# Patient Record
Sex: Male | Born: 1955 | Race: White | Hispanic: No | Smoking: Current every day smoker
Health system: Southern US, Community
[De-identification: ages and names within clinical notes are randomized; demographics above are authoritative.]

## PROBLEM LIST (undated history)

## (undated) DIAGNOSIS — I10 Essential (primary) hypertension: Secondary | ICD-10-CM

## (undated) DIAGNOSIS — E7800 Pure hypercholesterolemia, unspecified: Secondary | ICD-10-CM

## (undated) HISTORY — PX: GASTRIC BYPASS: SHX52

---

## 2017-07-31 ENCOUNTER — Ambulatory Visit
Admission: RE | Admit: 2017-07-31 | Discharge: 2017-07-31 | Disposition: A | Discharge status: Home / Self Care | Payer: PRIVATE HEALTH INSURANCE | Source: Ambulatory Visit | Attending: Chiropractic Medicine | Admitting: Chiropractic Medicine

## 2017-07-31 ENCOUNTER — Other Ambulatory Visit: Payer: Self-pay | Admitting: Chiropractic Medicine

## 2017-07-31 DIAGNOSIS — R29898 Other symptoms and signs involving the musculoskeletal system: Secondary | ICD-10-CM

## 2017-12-15 ENCOUNTER — Emergency Department (HOSPITAL_COMMUNITY): Payer: PRIVATE HEALTH INSURANCE

## 2017-12-15 ENCOUNTER — Other Ambulatory Visit: Payer: Self-pay

## 2017-12-15 ENCOUNTER — Emergency Department (HOSPITAL_COMMUNITY)
Admission: EM | Admit: 2017-12-15 | Discharge: 2017-12-15 | Disposition: A | Discharge status: Home / Self Care | Payer: PRIVATE HEALTH INSURANCE | Attending: Emergency Medicine | Admitting: Emergency Medicine

## 2017-12-15 ENCOUNTER — Encounter (HOSPITAL_COMMUNITY): Payer: Self-pay | Admitting: Emergency Medicine

## 2017-12-15 DIAGNOSIS — F17290 Nicotine dependence, other tobacco product, uncomplicated: Secondary | ICD-10-CM | POA: Diagnosis not present

## 2017-12-15 DIAGNOSIS — R079 Chest pain, unspecified: Secondary | ICD-10-CM | POA: Diagnosis not present

## 2017-12-15 DIAGNOSIS — I10 Essential (primary) hypertension: Secondary | ICD-10-CM | POA: Diagnosis not present

## 2017-12-15 HISTORY — DX: Pure hypercholesterolemia, unspecified: E78.00

## 2017-12-15 HISTORY — DX: Essential (primary) hypertension: I10

## 2017-12-15 LAB — COMPREHENSIVE METABOLIC PANEL
ALBUMIN: 4.5 g/dL (ref 3.5–5.0)
ALT: 23 U/L (ref 17–63)
AST: 23 U/L (ref 15–41)
Alkaline Phosphatase: 77 U/L (ref 38–126)
Anion gap: 12 (ref 5–15)
BUN: 14 mg/dL (ref 6–20)
CHLORIDE: 103 mmol/L (ref 101–111)
CO2: 25 mmol/L (ref 22–32)
CREATININE: 0.88 mg/dL (ref 0.61–1.24)
Calcium: 9 mg/dL (ref 8.9–10.3)
GFR calc Af Amer: >60 mL/min (ref >60)
GFR calc non Af Amer: >60 mL/min (ref >60)
Glucose, Bld: 113 mg/dL — ABNORMAL HIGH (ref 65–99)
POTASSIUM: 4 mmol/L (ref 3.5–5.1)
Sodium: 140 mmol/L (ref 135–145)
Total Bilirubin: 0.7 mg/dL (ref 0.3–1.2)
Total Protein: 7.1 g/dL (ref 6.5–8.1)

## 2017-12-15 LAB — LIPASE, BLOOD: LIPASE: 38 U/L (ref 11–51)

## 2017-12-15 LAB — CBC
HEMATOCRIT: 46.2 % (ref 39.0–52.0)
HEMOGLOBIN: 15.2 g/dL (ref 13.0–17.0)
MCH: 29.8 pg (ref 26.0–34.0)
MCHC: 32.9 g/dL (ref 30.0–36.0)
MCV: 90.6 fL (ref 78.0–100.0)
Platelets: 243 10*3/uL (ref 150–400)
RBC: 5.1 MIL/uL (ref 4.22–5.81)
RDW: 13.2 % (ref 11.5–15.5)
WBC: 7.5 10*3/uL (ref 4.0–10.5)

## 2017-12-15 LAB — I-STAT TROPONIN, ED
TROPONIN I, POC: 0.01 ng/mL (ref 0.00–0.08)
Troponin i, poc: 0 ng/mL (ref 0.00–0.08)

## 2017-12-15 MED ORDER — GI COCKTAIL ~~LOC~~
30.0000 mL | Freq: Once | ORAL | Status: AC
  Administered 2017-12-15: 30 mL via ORAL
  Filled 2017-12-15: qty 30

## 2017-12-15 MED ORDER — IBUPROFEN 800 MG PO TABS
800.0000 mg | ORAL_TABLET | Freq: Once | ORAL | Status: AC
  Administered 2017-12-15: 800 mg via ORAL
  Filled 2017-12-15: qty 1

## 2017-12-15 NOTE — Discharge Instructions (Addendum)
Please read attached information. If you experience any new or worsening signs or symptoms please return to the emergency room for evaluation. Please follow-up with your primary care provider or specialist as discussed.  °

## 2017-12-15 NOTE — ED Provider Notes (Signed)
[facility]   CSN: [id number] Arrival date & time: 12/15/17  1727     History   Chief Complaint Chief Complaint  Patient presents with  . Chest Pain    HPI Alexander MerlesMark Mcgee is a 62 y.o. male.  HPI   62 year old male presents today with complaints of chest pain/abdominal pain.  Patient notes approximately 2 and half hours ago while at work he noticed a dull ache in his epigastric and lower chest.  Patient notes some radiation to his back.  He denies any indigestion, lower abdominal pain nausea or vomiting.  He denies any diaphoresis.  No associated shortness of breath, orthopnea or worsening pain with ambulation.  Patient notes that he often has pain in his abdomen and epigastric area as he is status post gastric bypass.  He does note that he had 2 fajitas at lunch.  Patient denies any personal cardiac history, he notes a history of well-controlled hyperlipidemia, hypertension.  He denies any close family history of MIs or significant cardiac disease.  He does note he smokes cigars.  Patient notes he has been significantly stressed over the last week as he potentially has prostate cancer.  No history DVT or PE or any significant risk factors.   Past Medical History:  Diagnosis Date  . Hypercholesteremia   . Hypertension     There are no active problems to display for this patient.   Past Surgical History:  Procedure Laterality Date  . GASTRIC BYPASS         Home Medications    Prior to Admission medications   Not on File    Family History History reviewed. No pertinent family history.  Social History Social History   Tobacco Use  . Smoking status: Current Every Day Smoker    Types: Cigars  . Smokeless tobacco: Never Used  Substance Use Topics  . Alcohol use: Not Currently  . Drug use: Never     Allergies   Patient has no known allergies.   Review of Systems Review of Systems  All other systems reviewed  and are negative.  Physical Exam Updated Vital Signs BP (!) 165/89   Pulse 64   Temp 99 F (37.2 C) (Oral)   Resp 16   SpO2 99%   Physical Exam  Constitutional: He is oriented to person, place, and time. He appears well-developed and well-nourished.  HENT:  Head: Normocephalic and atraumatic.  Eyes: Pupils are equal, round, and reactive to light. Conjunctivae are normal. Right eye exhibits no discharge. Left eye exhibits no discharge. No scleral icterus.  Neck: Normal range of motion. No JVD present. No tracheal deviation present.  Cardiovascular: Normal rate, regular rhythm and normal heart sounds. Exam reveals no gallop and no friction rub.  No murmur heard. Pulmonary/Chest: Effort normal and breath sounds normal. No stridor. No respiratory distress. He has no wheezes. He has no rales. He exhibits no tenderness.  Abdominal: Soft. He exhibits no distension and no mass. There is no tenderness. There is no rebound and no guarding. No hernia.  Musculoskeletal: He exhibits no edema.  Neurological: He is alert and oriented to person, place, and time. Coordination normal.  Skin: Skin is warm.  Psychiatric: He has a normal mood and affect. His behavior is normal. Judgment and thought content normal.  Nursing note and vitals reviewed.    ED Treatments / Results  Labs (all labs ordered are listed, but only abnormal results are displayed) Labs Reviewed  COMPREHENSIVE METABOLIC PANEL - Abnormal; Notable for the following components:      Result Value   Glucose, Bld 113 (*)    All other components within normal limits  CBC  LIPASE, BLOOD  I-STAT TROPONIN, ED  I-STAT TROPONIN, ED    EKG EKG Interpretation  Date/Time:  Tuesday December 15 2017 17:34:23 EDT Ventricular Rate:  62 PR Interval:  186 QRS Duration: 90 QT Interval:  404 QTC Calculation: 410 R Axis:   98 Text Interpretation:  Normal sinus rhythm Rightward axis Borderline ECG no prior available for comparison Confirmed by  Tilden Fossa [phone]) on 12/15/2017 6:05:53 PM   Radiology Dg Chest 2 View  Result Date: 12/15/2017 CLINICAL DATA:  Sudden onset lower chest/epigastric pain radiating to the back. EXAM: CHEST - 2 VIEW COMPARISON:  None. FINDINGS: The cardiac silhouette is upper limits of normal in size. A curvilinear, tubular density projecting over the lateral left mid lung on the PA radiograph is likely external to the patient. No airspace consolidation, edema, pleural effusion, or pneumothorax is identified. No acute osseous abnormality is seen. IMPRESSION: No active cardiopulmonary disease. Electronically Signed   By: Sebastian Ache M.D.   On: 12/15/2017 18:34    Procedures Procedures (including critical care time)  Medications Ordered in ED Medications  ibuprofen (ADVIL,MOTRIN) tablet 800 mg (800 mg Oral Given 12/15/17 1854)  gi cocktail (Maalox,Lidocaine,Donnatal) (30 mLs Oral Given 12/15/17 1857)     Initial Impression / Assessment and Plan / ED Course  I have reviewed the triage vital signs and the nursing notes.  Pertinent labs & imaging results that were available during my care of the patient were reviewed by me and considered in my medical decision making (see chart for details).     Labs: I-STAT troponin, CBC CMP, lipase  Imaging: DG chest 2 view G  Consults:  Therapeutics: Ibuprofen, GI cocktail  Discharge Meds:   Assessment/Plan: 62 year old now presents today with complaints of chest and upper abdominal pain.  Patient has reassuring initial evaluation with negative chest x-ray reassuring EKG and negative troponin.  His story is not consistent with ACS.  He had improvement with ibuprofen and GI cocktail and is at his baseline.  Patient will have delta troponin.  He has no signs of PE, dissection, infection or any other acute life-threatening intrathoracic or abdominal pathology.  Patient had resolution of symptoms here in the ED, he had a second troponin with no significant  elevation.  Patient is well-appearing with a low heart score do find he is reasonable for outpatient follow-up with cardiology.  I do have lower suspicion for cardiac etiology at this time.  Both the patient and his wife agree for outpatient follow-up, he is given strict return precautions, he verbalized understanding and agreement to today's plan had no further questions or concerns at the time discharge.   Final Clinical Impressions(s) / ED Diagnoses   Final diagnoses:  Nonspecific chest pain    ED Discharge Orders    None       Rosalio Loud 12/15/17 2121    Tilden Fossa, MD 12/16/17 1330

## 2017-12-15 NOTE — ED Triage Notes (Signed)
Pt states he started having CP around 3pm. Central- radiates to back between shoulders. Denies nausea, diaphoresis.

## 2017-12-15 NOTE — ED Provider Notes (Signed)
Patient placed in Quick Look pathway, seen and evaluated   Chief Complaint: chest pain  HPI:   Patient presents for 2.5hr history of sudden onset lower chest/epigastric pain radiating to back. Has not tried to take anything for pain. Denies nausea, diaphoresis. History of HTN, HLD both well controlled. Reports tobacco use. Has a remote history of stress test done many years ago. No prior history of MI, DVT, PE, recent surgeries, recent prolonged travel.  ROS: chest pain  Physical Exam:   Gen: No distress  Neuro: Awake and Alert  Skin: Warm    Focused Exam: no abdominal TTP, no chest TTP. RRR. Lungs CTAB.   Initiation of care has begun. The patient has been counseled on the process, plan, and necessity for staying for the completion/evaluation, and the remainder of the medical screening examination    Dietrich PatesKhatri, Dinna Severs, PA-C 12/15/17 1754    Abelino DerrickMackuen, Courteney Lyn, MD 12/15/17 2357

## 2020-05-28 IMAGING — DX DG CHEST 2V
2 series · 2 of 2 positions shown · non-contrast
Comparison: None.

CLINICAL DATA: Sudden onset lower chest/epigastric pain radiating
to the back.

EXAM:
CHEST - 2 VIEW

[chest pa]
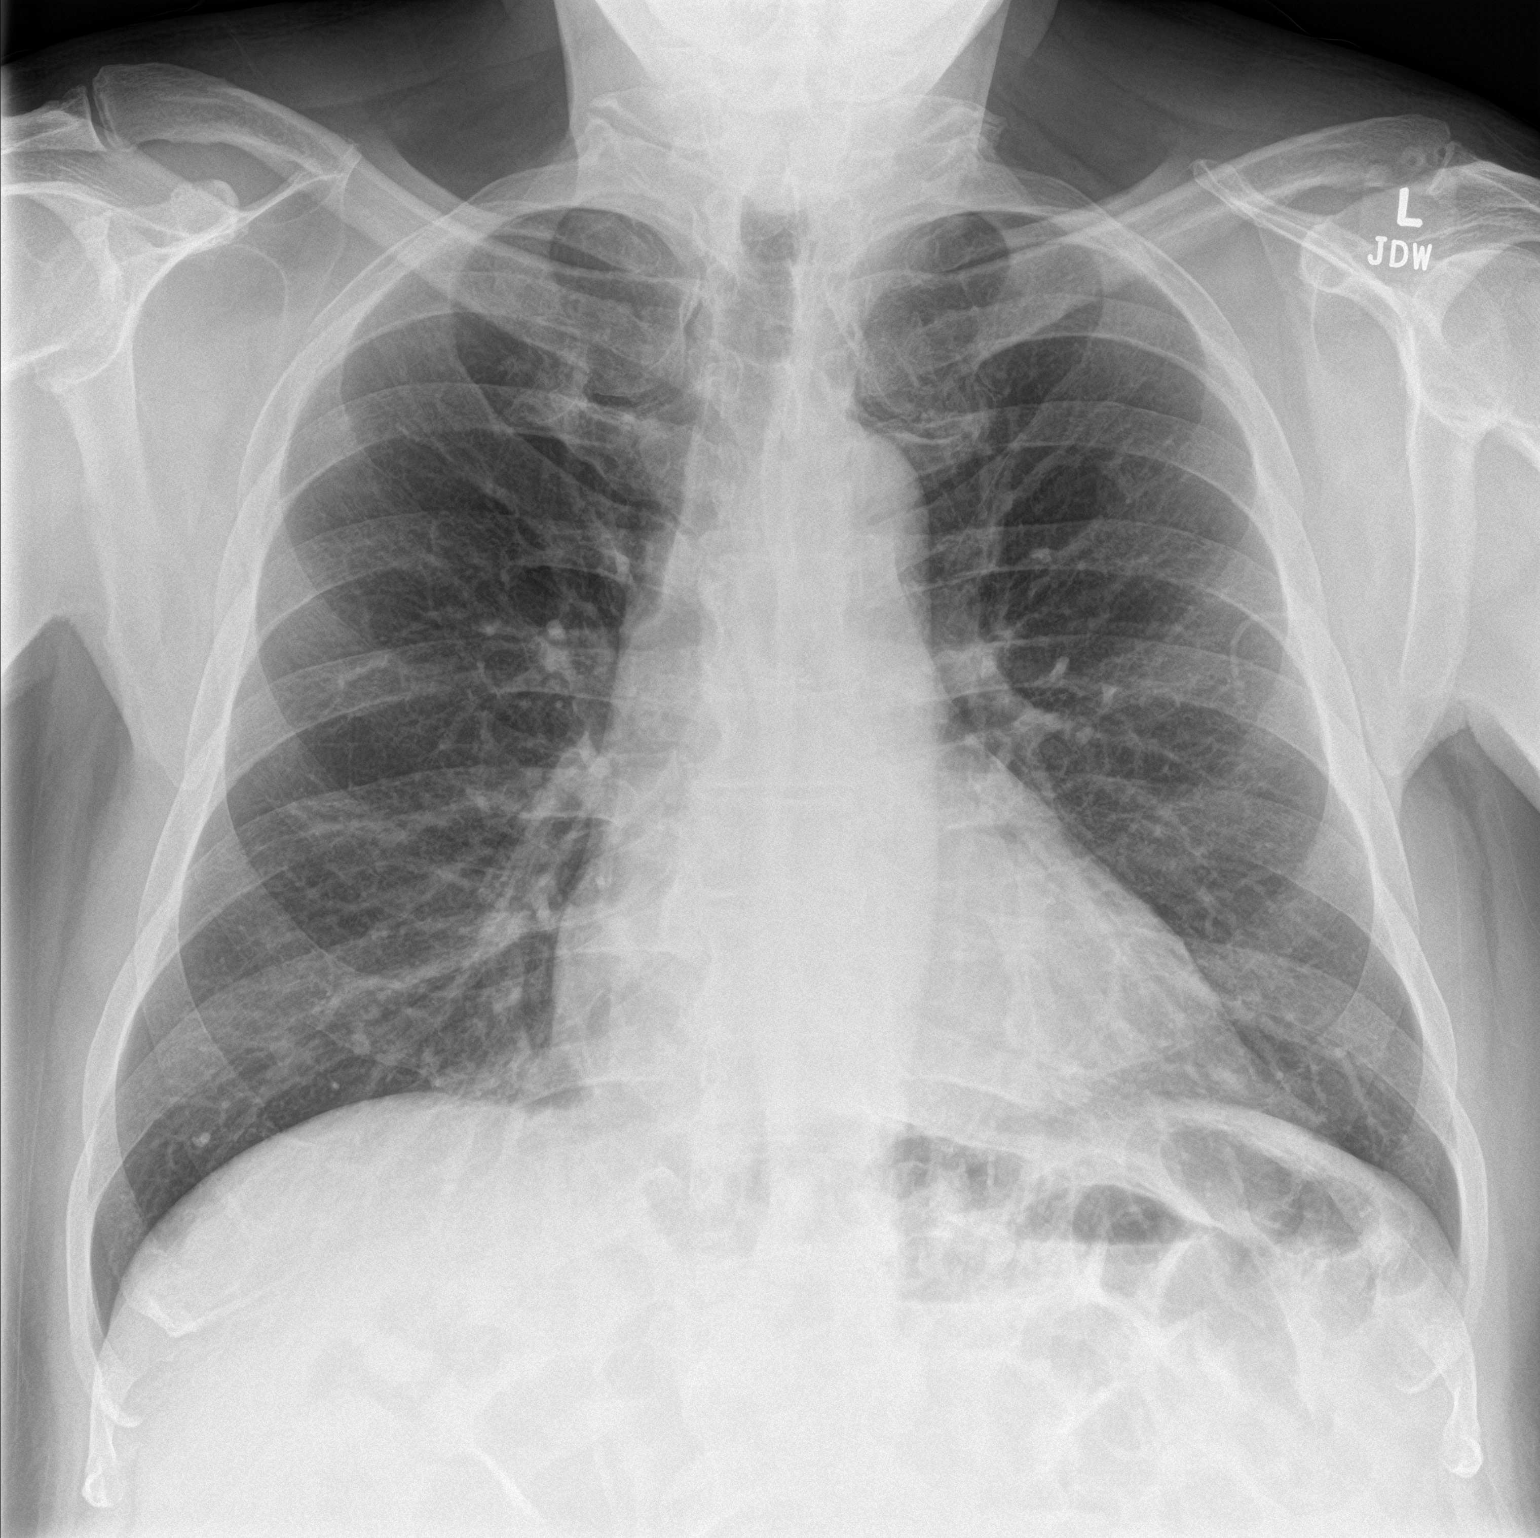

[chest lat]
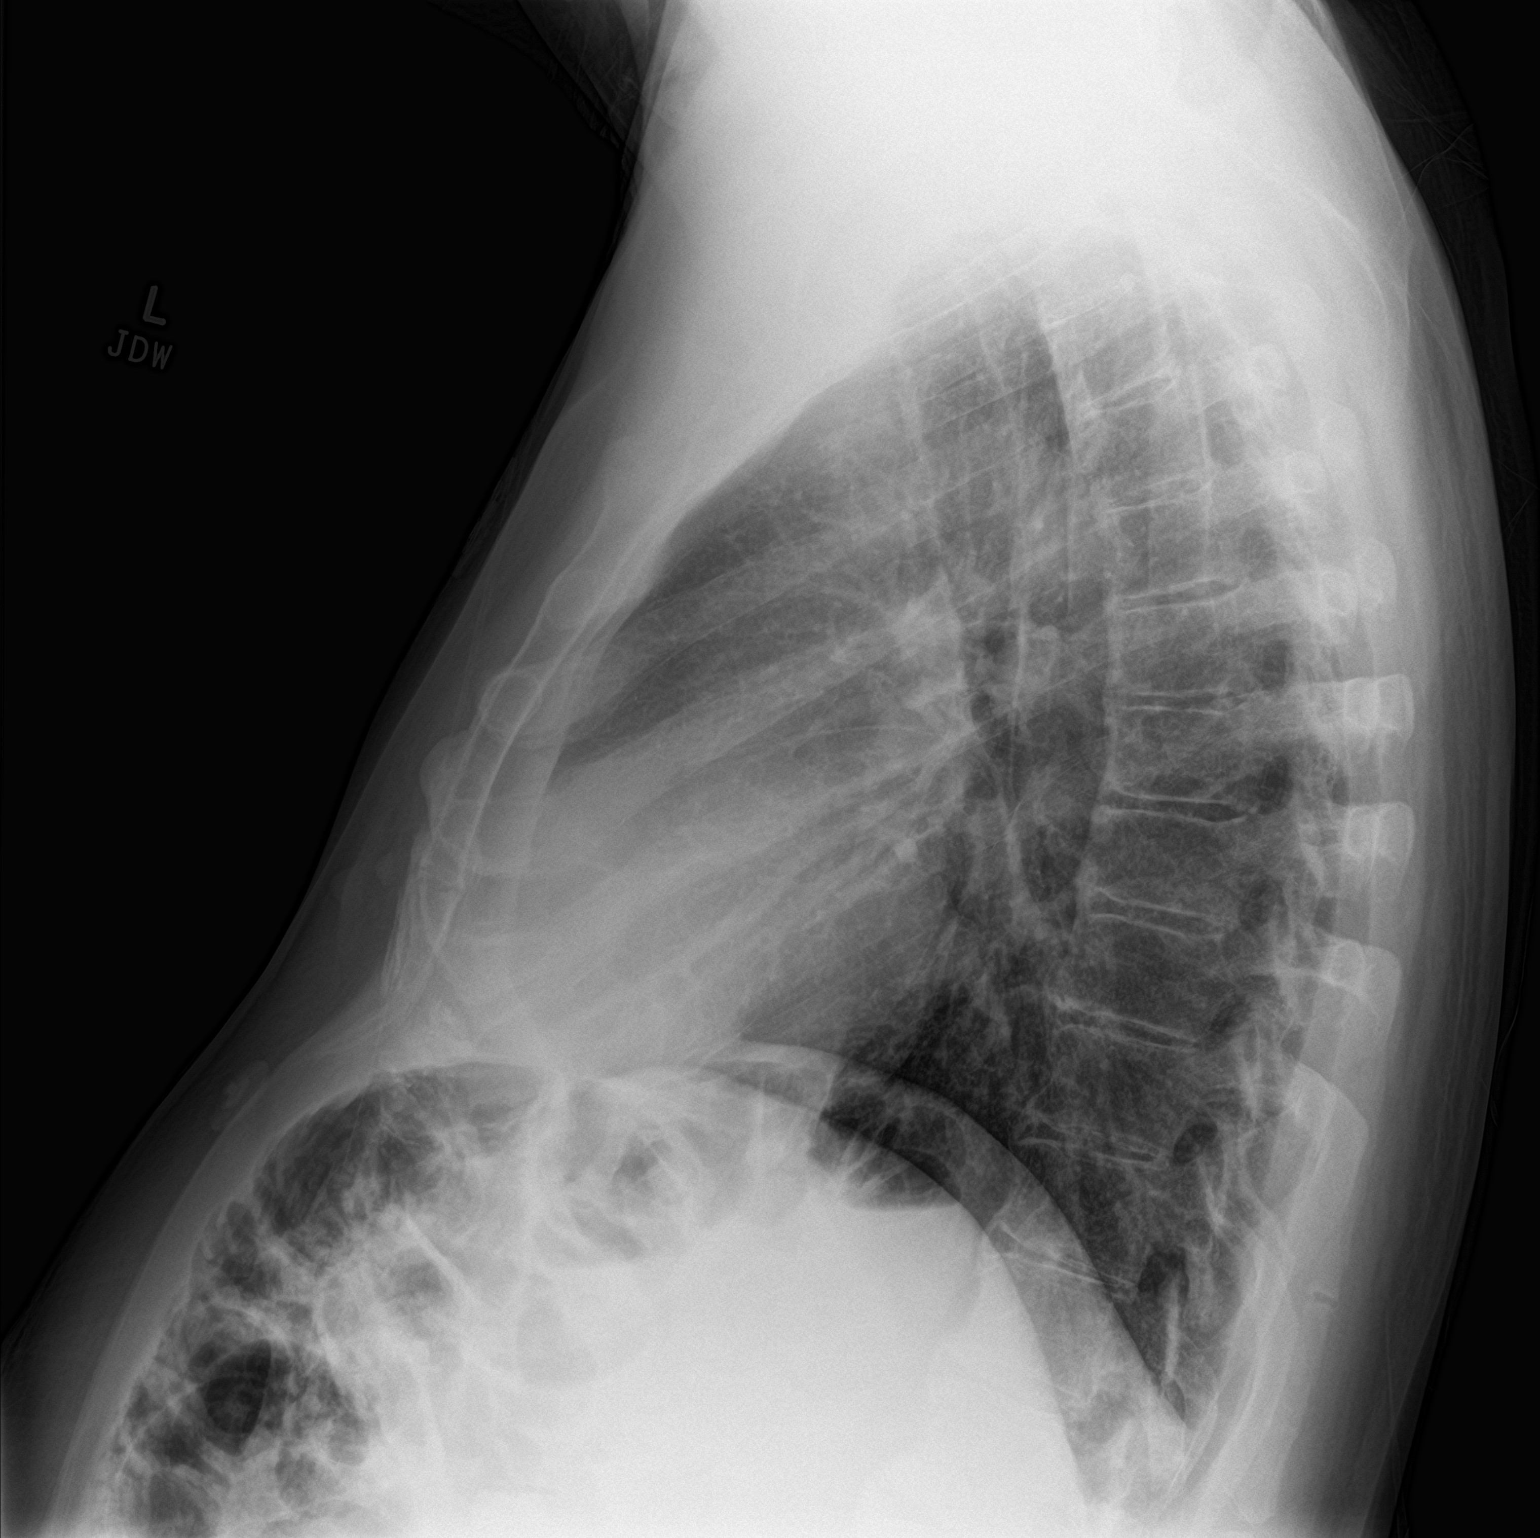

[2 of 2 positions shown; findings below may reference images not displayed]

FINDINGS: The cardiac silhouette is upper limits of normal in size. A
curvilinear, tubular density projecting over the lateral left mid
lung on the PA radiograph is likely external to the patient. No
airspace consolidation, edema, pleural effusion, or pneumothorax is
identified. No acute osseous abnormality is seen.
IMPRESSION: No active cardiopulmonary disease.

## 2020-12-20 ENCOUNTER — Other Ambulatory Visit: Payer: Self-pay | Admitting: Internal Medicine

## 2020-12-20 DIAGNOSIS — I10 Essential (primary) hypertension: Secondary | ICD-10-CM

## 2021-01-11 ENCOUNTER — Ambulatory Visit
Admission: RE | Admit: 2021-01-11 | Discharge: 2021-01-11 | Disposition: A | Discharge status: Home / Self Care | Payer: No Typology Code available for payment source | Source: Ambulatory Visit | Attending: Internal Medicine | Admitting: Internal Medicine

## 2021-01-11 DIAGNOSIS — I10 Essential (primary) hypertension: Secondary | ICD-10-CM

## 2024-05-12 ENCOUNTER — Other Ambulatory Visit: Payer: Self-pay | Admitting: Chiropractic Medicine

## 2024-05-12 ENCOUNTER — Ambulatory Visit
Admission: RE | Admit: 2024-05-12 | Discharge: 2024-05-12 | Disposition: A | Discharge status: Home / Self Care | Payer: PRIVATE HEALTH INSURANCE | Source: Ambulatory Visit | Attending: Chiropractic Medicine | Admitting: Chiropractic Medicine

## 2024-05-12 DIAGNOSIS — M545 Low back pain, unspecified: Secondary | ICD-10-CM

## 2024-05-12 DIAGNOSIS — M2560 Stiffness of unspecified joint, not elsewhere classified: Secondary | ICD-10-CM

## 2024-05-19 ENCOUNTER — Other Ambulatory Visit: Payer: Self-pay | Admitting: Chiropractic Medicine

## 2024-05-19 DIAGNOSIS — M545 Low back pain, unspecified: Secondary | ICD-10-CM

## 2024-05-30 ENCOUNTER — Ambulatory Visit
Admission: RE | Admit: 2024-05-30 | Discharge: 2024-05-30 | Disposition: A | Payer: PRIVATE HEALTH INSURANCE | Source: Ambulatory Visit | Attending: Chiropractic Medicine | Admitting: Chiropractic Medicine

## 2024-05-30 DIAGNOSIS — M545 Low back pain, unspecified: Secondary | ICD-10-CM
